# Patient Record
Sex: Male | Born: 1980 | Race: White | Hispanic: No | Marital: Single
Health system: Midwestern US, Community
[De-identification: ages and names within clinical notes are randomized; demographics above are authoritative.]

## PROBLEM LIST (undated history)

## (undated) HISTORY — PX: ANTERIOR CRUCIATE LIGAMENT REPAIR: SHX115

## (undated) MED ORDER — TRAMADOL 50 MG TAB: 50 mg | ORAL_TABLET | Freq: Four times a day (QID) | ORAL | Status: AC | PRN

---

## 2013-01-17 NOTE — ED Provider Notes (Signed)
HPI Comments: Pt woke up this morning with a stiff R knee, had difficulty straightening it. Eventually was able to straighten it but felt a pop in doing so and now has pain to his R knee. No known trauma, no sim pain in the past. Has had surgery to that knee in the remote past, no problems since    Patient is a 33 y.o. male presenting with knee pain. The history is provided by the patient.   Knee Pain   This is a new problem. The current episode started 1 to 2 hours ago. The problem occurs constantly. The problem has not changed since onset.The pain is present in the right knee. The quality of the pain is described as aching. The pain is moderate. Associated symptoms include limited range of motion and stiffness. Pertinent negatives include no numbness. The symptoms are aggravated by movement. He has tried nothing for the symptoms. The treatment provided no relief. There has been no history of extremity trauma.        History reviewed. No pertinent past medical history.     Past Surgical History   Procedure Laterality Date   ??? Hx orthopaedic  1999     right knee          History reviewed. No pertinent family history.     History     Social History   ??? Marital Status: SINGLE     Spouse Name: N/A     Number of Children: N/A   ??? Years of Education: N/A     Occupational History   ??? Not on file.     Social History Main Topics   ??? Smoking status: Current Every Day Smoker -- 0.50 packs/day   ??? Smokeless tobacco: Not on file   ??? Alcohol Use: No   ??? Drug Use: No   ??? Sexually Active: Not on file     Other Topics Concern   ??? Not on file     Social History Narrative   ??? No narrative on file                  ALLERGIES: Pcn      Review of Systems   Constitutional: Negative for fever and chills.   Gastrointestinal: Negative for nausea and vomiting.   Musculoskeletal: Positive for stiffness.   Neurological: Negative for numbness.       Filed Vitals:    01/17/13 0436   BP: 136/85   Pulse: 84   Temp: 98.6 ??F (37 ??C)   Resp: 18    Height: 5\' 6"  (1.676 m)   Weight: 81.647 kg (180 lb)   SpO2: 98%            Physical Exam   Nursing note and vitals reviewed.  Constitutional: He is oriented to person, place, and time. He appears well-developed and well-nourished.   HENT:   Head: Normocephalic and atraumatic.   Eyes: Conjunctivae are normal. Pupils are equal, round, and reactive to light.   Neck: Normal range of motion. Neck supple.   Musculoskeletal: He exhibits tenderness. He exhibits no edema.   R knee tend globally with decreased ROM due to discomfort. No appreciable swelling, RLE NV intact distally   Neurological: He is alert and oriented to person, place, and time.   Skin: Skin is warm and dry.   Psychiatric: He has a normal mood and affect. His behavior is normal.        MDM     Differential  Diagnosis; Clinical Impression; Plan:     differential diagnosis: knee sprain/fracture, ligament tear  5:23 AM Discussed xrays with pt, will provide crutches and knee immobilizer  Amount and/or Complexity of Data Reviewed:   Tests in the radiology section of CPT??:  Ordered and reviewed   Independant visualization of image, tracing, or specimen:  Yes  Risk of Significant Complications, Morbidity, and/or Mortality:   Presenting problems:  Moderate  Diagnostic procedures:  Moderate  Management options:  Moderate      Procedures

## 2013-01-17 NOTE — ED Notes (Signed)
I have reviewed discharge instructions with the patient.  The patient verbalized understanding.

## 2018-04-14 ENCOUNTER — Emergency Department
Admission: EM | Admit: 2018-04-14 | Discharge: 2018-04-14 | Disposition: A | Discharge status: Home / Self Care | Payer: Self-pay | Attending: Emergency Medicine | Admitting: Emergency Medicine

## 2018-04-14 ENCOUNTER — Encounter: Payer: Self-pay | Admitting: Emergency Medicine

## 2018-04-14 ENCOUNTER — Emergency Department: Payer: Self-pay

## 2018-04-14 ENCOUNTER — Other Ambulatory Visit: Payer: Self-pay

## 2018-04-14 DIAGNOSIS — F17210 Nicotine dependence, cigarettes, uncomplicated: Secondary | ICD-10-CM | POA: Insufficient documentation

## 2018-04-14 DIAGNOSIS — M25561 Pain in right knee: Secondary | ICD-10-CM | POA: Insufficient documentation

## 2018-04-14 MED ORDER — KETOROLAC TROMETHAMINE 60 MG/2ML IM SOLN
60.0000 mg | Freq: Once | INTRAMUSCULAR | Status: AC
  Administered 2018-04-14: 60 mg via INTRAMUSCULAR
  Filled 2018-04-14: qty 2

## 2018-04-14 MED ORDER — KETOROLAC TROMETHAMINE 10 MG PO TABS: 10.0000 mg | ORAL_TABLET | Freq: Four times a day (QID) | ORAL | 0 refills | Status: AC | PRN

## 2018-04-14 NOTE — ED Notes (Signed)
Pt slept through toradol injection.

## 2018-04-14 NOTE — ED Provider Notes (Signed)
[facility] Emergency Department Provider Note    First MD Initiated Contact with Patient 04/14/18 0304     (approximate)  I have reviewed the triage vital signs and the nursing notes.   HISTORY  Chief Complaint Knee Pain   HPI Dale James is a 38 y.o. male with medical history as listed below presents to the emergency department with "10 out of 10 right knee pain.  Patient states that he "bumped his knee against something while at work.  Patient was able to ambulate into the room without any observable difficulty.        History reviewed. No pertinent past medical history.  There are no active problems to display for this patient.   Past Surgical History:  Procedure Laterality Date  . ANTERIOR CRUCIATE LIGAMENT REPAIR      Prior to Admission medications   Medication Sig Start Date End Date Taking? Authorizing Provider  ketorolac (TORADOL) 10 MG tablet Take 1 tablet (10 mg total) by mouth every 6 (six) hours as needed. 04/14/18   Darci Current, MD    Allergies Penicillins  No family history on file.  Social History Social History   Tobacco Use  . Smoking status: Current Every Day Smoker  . Smokeless tobacco: Never Used  Substance Use Topics  . Alcohol use: Not on file  . Drug use: Not on file    Review of Systems Constitutional: No fever/chills Eyes: No visual changes. ENT: No sore throat. Cardiovascular: Denies chest pain. Respiratory: Denies shortness of breath. Gastrointestinal: No abdominal pain.  No nausea, no vomiting.  No diarrhea.  No constipation. Genitourinary: Negative for dysuria. Musculoskeletal: Negative for neck pain.  Negative for back pain.  Positive for right knee pain Integumentary: Negative for rash. Neurological: Negative for headaches, focal weakness or numbness.  ____________________________________________   PHYSICAL EXAM:  VITAL SIGNS: ED Triage Vitals  Enc Vitals Group     BP 04/14/18 0246  (!) 149/98     Pulse Rate 04/14/18 0246 98     Resp 04/14/18 0246 18     Temp 04/14/18 0246 97.9 F (36.6 C)     Temp Source 04/14/18 0246 Oral     SpO2 04/14/18 0246 98 %     Weight 04/14/18 0244 83.9 kg (185 lb)     Height 04/14/18 0244 1.702 m (5\' 7" )     Head Circumference --      Peak Flow --      Pain Score 04/14/18 0244 10     Pain Loc --      Pain Edu? --      Excl. in GC? --     Constitutional: Asleep on my arrival to the room.  Falling asleep during evaluation Eyes: Conjunctivae are normal. Head: Atraumatic.} Neck: No stridor.   Cardiovascular: Normal rate, regular rhythm. Good peripheral circulation. Grossly normal heart sounds. Respiratory: Normal respiratory effort.  No retractions. Lungs CTAB. Gastrointestinal: Soft and nontender. No distention.  Musculoskeletal: Diffuse knee pain with very gentle palpation.  Pain with active and passive range of motion in all directions. Neurologic:  Normal speech and language. No gross focal neurologic deficits are appreciated.  Skin:  Skin is warm, dry and intact. No rash noted.   ____________________________________________  RADIOLOGY I, Darci Current, personally viewed and evaluated these images (plain radiographs) as part of my medical decision making, as well as reviewing the written report by the radiologist.  ED MD interpretation: Negative right knee x-ray.  Official  radiology report(s): Dg Knee Complete 4 Views Right  Result Date: 04/14/2018 CLINICAL DATA:  Knee pain EXAM: RIGHT KNEE - COMPLETE 4+ VIEW COMPARISON:  None. FINDINGS: No evidence of fracture, dislocation, or joint effusion. No evidence of arthropathy or other focal bone abnormality. Soft tissues are unremarkable. Prior ACL surgery. IMPRESSION: Negative. Electronically Signed   By: Deatra Robinson M.D.   On: 04/14/2018 04:16      Procedures   ____________________________________________   INITIAL IMPRESSION / MDM / ASSESSMENT AND PLAN / ED COURSE   As part of my medical decision making, I reviewed the following data within the electronic MEDICAL RECORD NUMBER   Dale James was evaluated in Emergency Department on 04/14/2018 for the symptoms described in the history of present illness. He was evaluated in the context of the global COVID-19 pandemic, which necessitated consideration that the patient might be at risk for infection with the SARS-CoV-2 virus that causes COVID-19. Institutional protocols and algorithms that pertain to the evaluation of patients at risk for COVID-19 are in a state of rapid change based on information released by regulatory bodies including the CDC and federal and state organizations. These policies and algorithms were followed during the patient's care in the ED.      38 year old male presenting with above-stated history and physical exam secondary to right knee pain patient was asleep on my arrival to room and repetitively fell asleep during my evaluation however while awake patient was able to answer questions appropriately.  Right knee x-ray was performed which revealed no acute pathology.  Patient given IM Toradol here in the emergency department discharge home with p.o. Toradol.  Patient ambulated without any observable difficulty.  ______________________________  FINAL CLINICAL IMPRESSION(S) / ED DIAGNOSES  Final diagnoses:  Acute pain of right knee     MEDICATIONS GIVEN DURING THIS VISIT:  Medications  ketorolac (TORADOL) injection 60 mg (60 mg Intramuscular Given 04/14/18 0325)     ED Discharge Orders         Ordered    ketorolac (TORADOL) 10 MG tablet  Every 6 hours PRN     04/14/18 0427           Note:  This document was prepared using Dragon voice recognition software and may include unintentional dictation errors.   Darci Current, MD 04/14/18 [phone]

## 2018-04-14 NOTE — ED Triage Notes (Signed)
Patient with complaint of right knee pain times one hour.

## 2019-11-30 IMAGING — DX RIGHT KNEE - COMPLETE 4+ VIEW
4 series · 4 of 4 positions shown · non-contrast
Comparison: None.

CLINICAL DATA: Knee pain

EXAM:
RIGHT KNEE - COMPLETE 4+ VIEW

[knee ap]
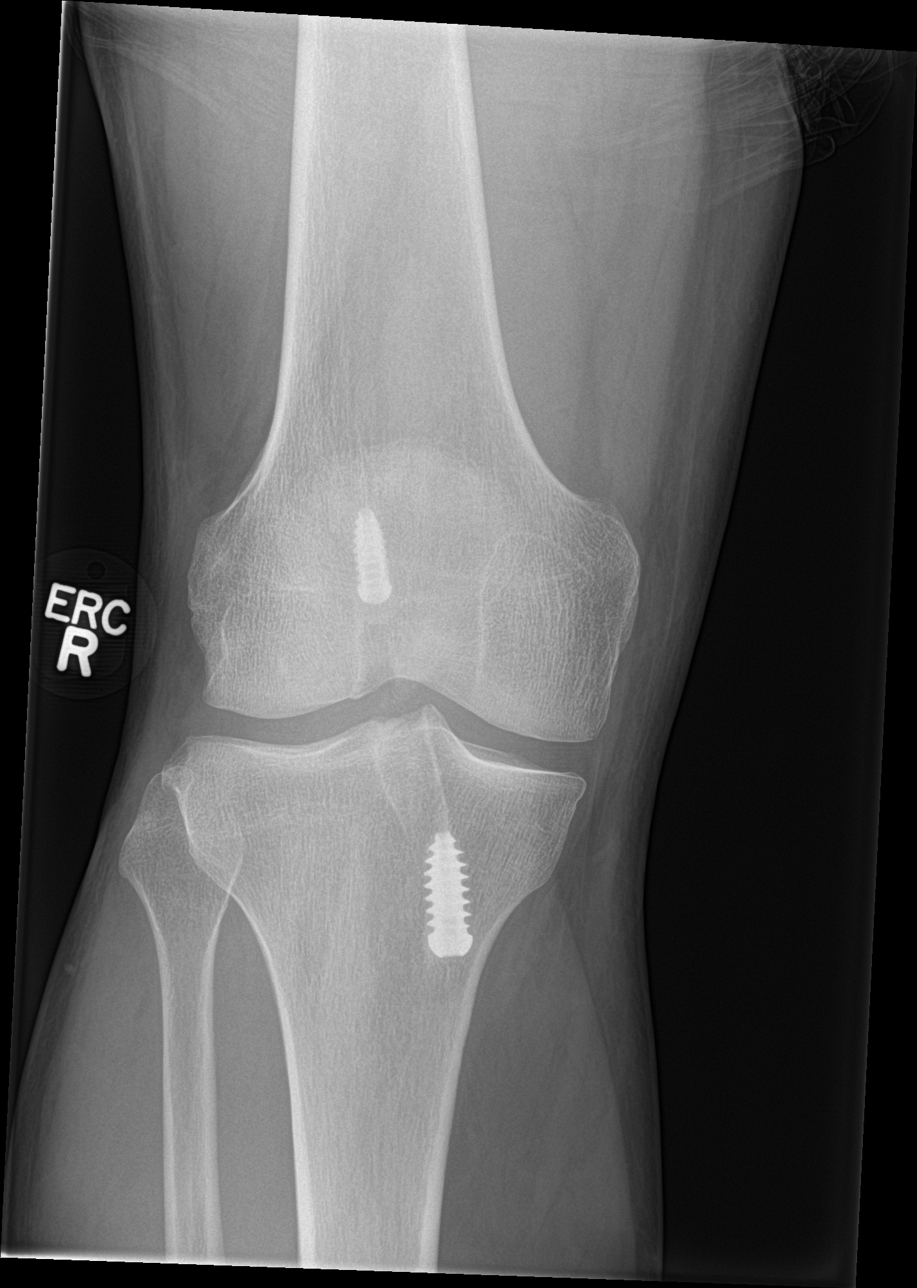

[knee obl (1 of 2)]
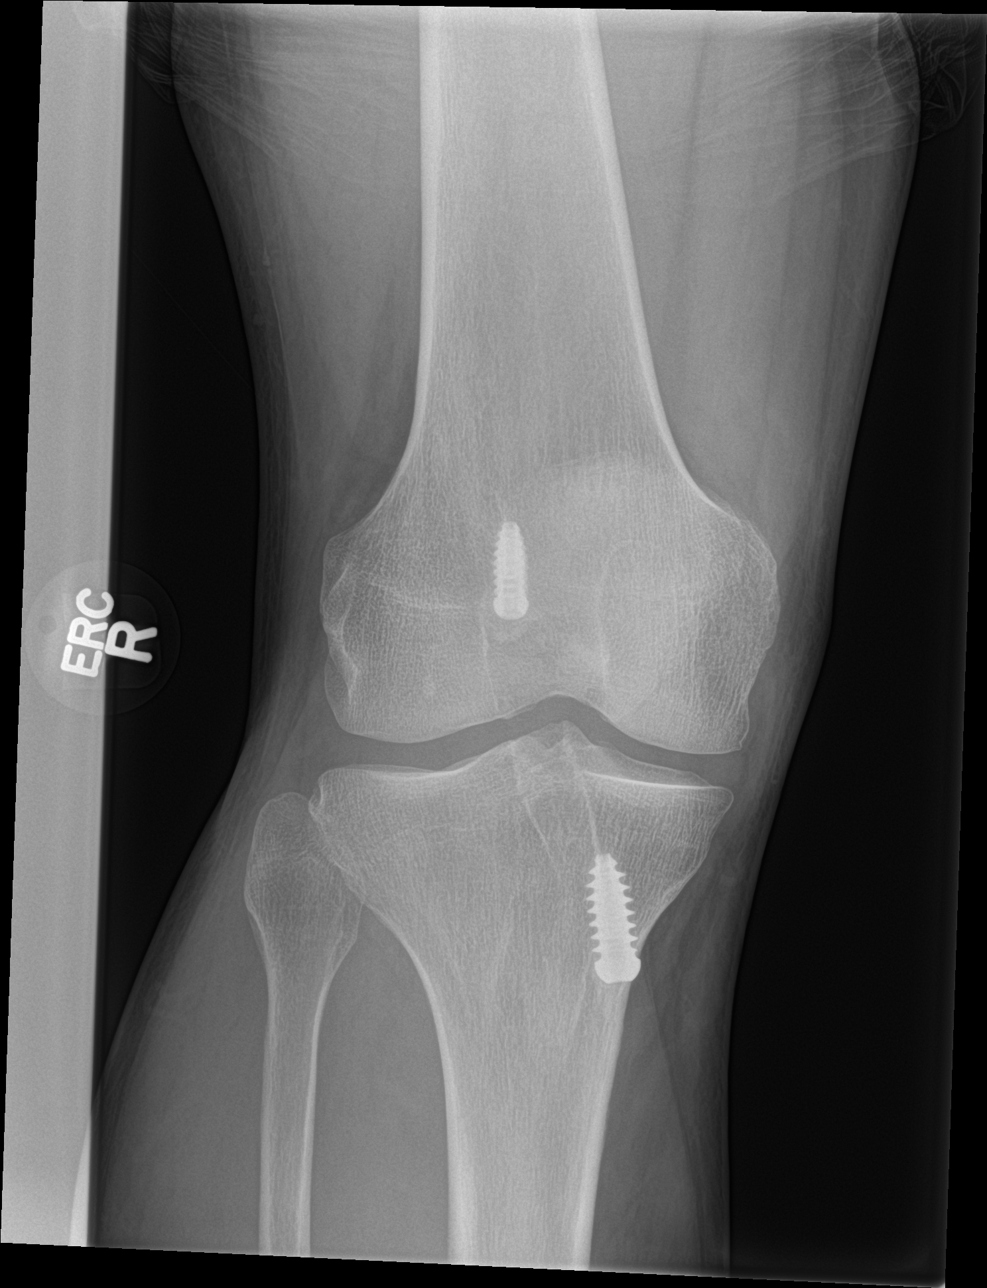

[knee obl (2 of 2)]
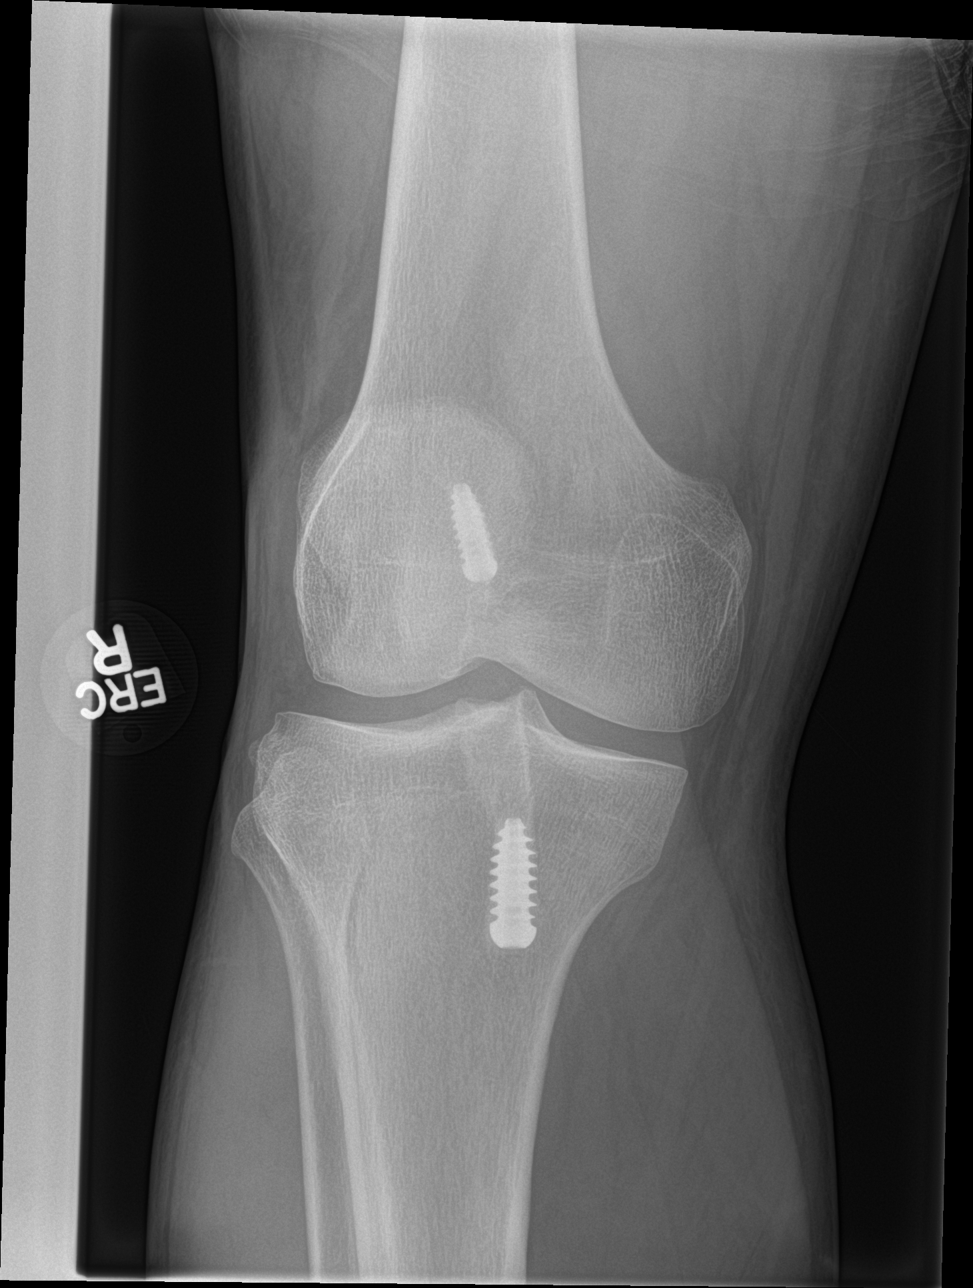

[knee lat]
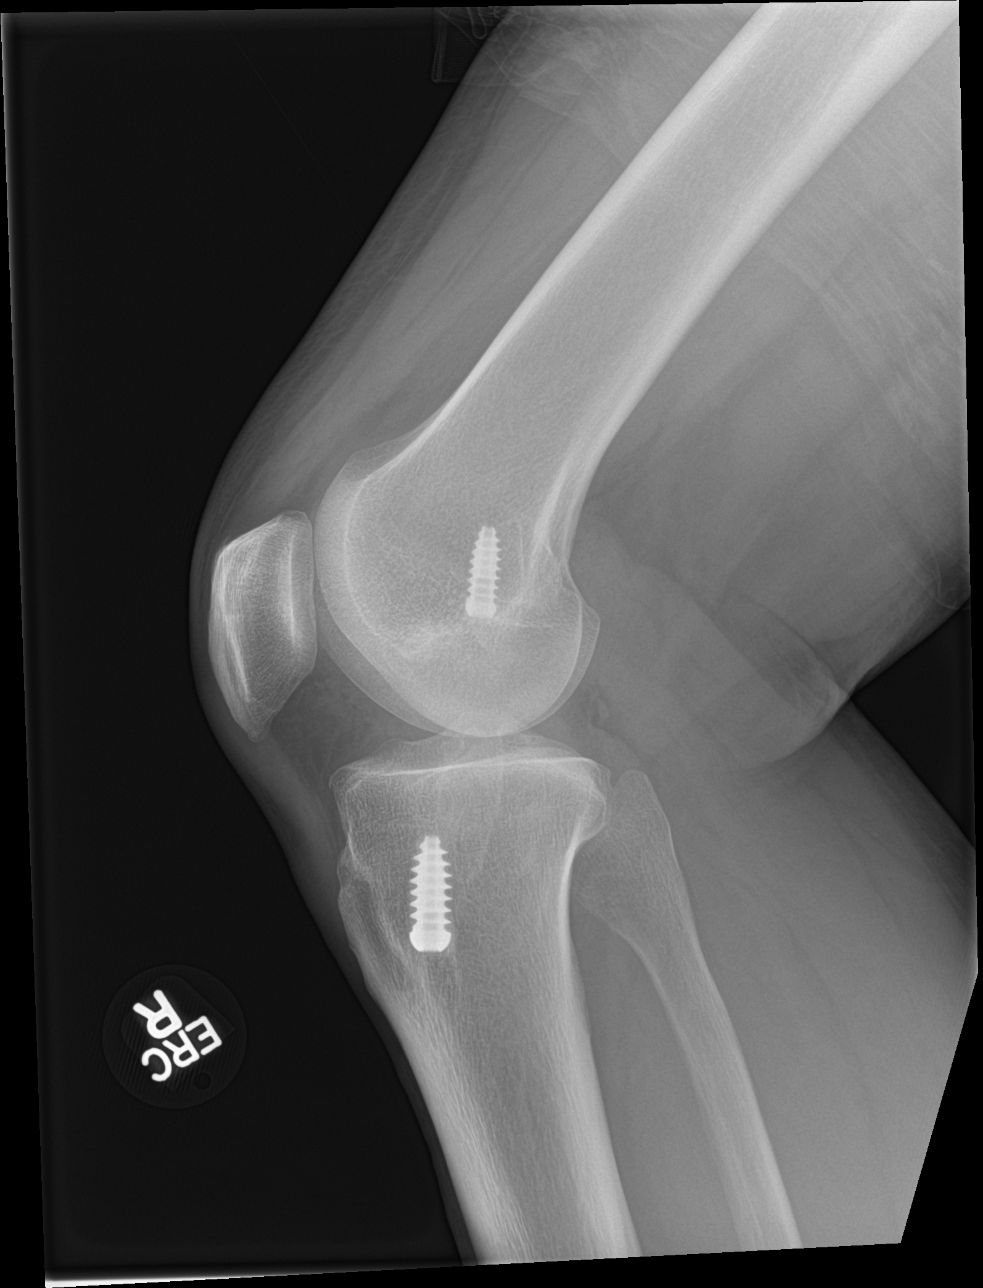

[4 of 4 positions shown; findings below may reference images not displayed]

FINDINGS: No evidence of fracture, dislocation, or joint effusion. No evidence
of arthropathy or other focal bone abnormality. Soft tissues are
unremarkable. Prior ACL surgery.
IMPRESSION: Negative.
